# Patient Record
Sex: Female | Born: 1985
Health system: Southern US, Community
[De-identification: ages and names within clinical notes are randomized; demographics above are authoritative.]

## PROBLEM LIST (undated history)

## (undated) DIAGNOSIS — Z789 Other specified health status: Secondary | ICD-10-CM

## (undated) DIAGNOSIS — M5030 Other cervical disc degeneration, unspecified cervical region: Secondary | ICD-10-CM

## (undated) HISTORY — DX: Other specified health status: Z78.9

## (undated) HISTORY — PX: LIPOSUCTION: SHX10

---
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
## 2009-05-04 ENCOUNTER — Emergency Department: Payer: Self-pay | Admitting: Unknown Physician Specialty

## 2018-08-18 ENCOUNTER — Emergency Department (HOSPITAL_COMMUNITY): Payer: 59

## 2018-08-18 ENCOUNTER — Emergency Department (HOSPITAL_COMMUNITY)
Admission: EM | Admit: 2018-08-18 | Discharge: 2018-08-18 | Disposition: A | Discharge status: Home / Self Care | Payer: 59 | Attending: Emergency Medicine | Admitting: Emergency Medicine

## 2018-08-18 ENCOUNTER — Encounter (HOSPITAL_COMMUNITY): Payer: Self-pay | Admitting: Emergency Medicine

## 2018-08-18 ENCOUNTER — Other Ambulatory Visit: Payer: Self-pay

## 2018-08-18 DIAGNOSIS — N946 Dysmenorrhea, unspecified: Secondary | ICD-10-CM

## 2018-08-18 DIAGNOSIS — R1084 Generalized abdominal pain: Secondary | ICD-10-CM | POA: Diagnosis present

## 2018-08-18 LAB — CBC WITH DIFFERENTIAL/PLATELET
Abs Immature Granulocytes: 0.04 10*3/uL (ref 0.00–0.07)
Basophils Absolute: 0.1 10*3/uL (ref 0.0–0.1)
Basophils Relative: 0 %
Eosinophils Absolute: 0.2 10*3/uL (ref 0.0–0.5)
Eosinophils Relative: 2 %
HCT: 40.1 % (ref 36.0–46.0)
Hemoglobin: 13 g/dL (ref 12.0–15.0)
Immature Granulocytes: 0 %
Lymphocytes Relative: 19 %
Lymphs Abs: 2.6 10*3/uL (ref 0.7–4.0)
MCH: 28.1 pg (ref 26.0–34.0)
MCHC: 32.4 g/dL (ref 30.0–36.0)
MCV: 86.8 fL (ref 80.0–100.0)
Monocytes Absolute: 0.6 10*3/uL (ref 0.1–1.0)
Monocytes Relative: 4 %
Neutro Abs: 10.2 10*3/uL — ABNORMAL HIGH (ref 1.7–7.7)
Neutrophils Relative %: 75 %
Platelets: 310 10*3/uL (ref 150–400)
RBC: 4.62 MIL/uL (ref 3.87–5.11)
RDW: 13.3 % (ref 11.5–15.5)
WBC: 13.7 10*3/uL — ABNORMAL HIGH (ref 4.0–10.5)
nRBC: 0 % (ref 0.0–0.2)

## 2018-08-18 LAB — BASIC METABOLIC PANEL
Anion gap: 12 (ref 5–15)
BUN: 12 mg/dL (ref 6–20)
CO2: 19 mmol/L — ABNORMAL LOW (ref 22–32)
Calcium: 9.5 mg/dL (ref 8.9–10.3)
Chloride: 108 mmol/L (ref 98–111)
Creatinine, Ser: 0.91 mg/dL (ref 0.44–1.00)
GFR calc Af Amer: >60 mL/min (ref >60)
GFR calc non Af Amer: >60 mL/min (ref >60)
Glucose, Bld: 121 mg/dL — ABNORMAL HIGH (ref 70–99)
Potassium: 3.7 mmol/L (ref 3.5–5.1)
Sodium: 139 mmol/L (ref 135–145)

## 2018-08-18 LAB — I-STAT BETA HCG BLOOD, ED (MC, WL, AP ONLY): I-stat hCG, quantitative: <5 m[IU]/mL (ref <5)

## 2018-08-18 MED ORDER — HYDROMORPHONE HCL 1 MG/ML IJ SOLN
1.0000 mg | Freq: Once | INTRAMUSCULAR | Status: AC
  Administered 2018-08-18: 1 mg via INTRAVENOUS
  Filled 2018-08-18: qty 1

## 2018-08-18 MED ORDER — SODIUM CHLORIDE 0.9 % IV BOLUS
1000.0000 mL | Freq: Once | INTRAVENOUS | Status: AC
  Administered 2018-08-18: 1000 mL via INTRAVENOUS

## 2018-08-18 MED ORDER — KETOROLAC TROMETHAMINE 30 MG/ML IJ SOLN
15.0000 mg | Freq: Once | INTRAMUSCULAR | Status: AC
  Administered 2018-08-18: 15 mg via INTRAVENOUS
  Filled 2018-08-18: qty 1

## 2018-08-18 MED ORDER — HYDROCODONE-ACETAMINOPHEN 5-325 MG PO TABS: 1.0000 | ORAL_TABLET | ORAL | 0 refills | Status: DC | PRN

## 2018-08-18 MED ORDER — IBUPROFEN 800 MG PO TABS: 800.0000 mg | ORAL_TABLET | Freq: Four times a day (QID) | ORAL | 0 refills | Status: DC | PRN

## 2018-08-18 MED ORDER — IOPAMIDOL (ISOVUE-300) INJECTION 61%
100.0000 mL | Freq: Once | INTRAVENOUS | Status: AC | PRN
  Administered 2018-08-18: 100 mL via INTRAVENOUS

## 2018-08-18 MED ORDER — ONDANSETRON HCL 4 MG/2ML IJ SOLN
4.0000 mg | Freq: Once | INTRAMUSCULAR | Status: AC
  Administered 2018-08-18: 4 mg via INTRAVENOUS
  Filled 2018-08-18: qty 2

## 2018-08-18 NOTE — ED Triage Notes (Signed)
Pt coming in by EMS. Reporting 10 out of 10 pelvic/abd pain that started around 0300. Pt is on 2nd day of her menstrual cycle. Pt diaphoretic. Hx of painful menstrual cramps but never this bad.

## 2018-08-18 NOTE — ED Notes (Signed)
Pt transported to CT ?

## 2018-08-18 NOTE — ED Provider Notes (Signed)
[facility] EMERGENCY DEPARTMENT Provider Note   CSN: [id number] Arrival date & time: 08/18/18  9371     History   Chief Complaint Chief Complaint  Patient presents with  . Abdominal Cramping    Menstral cramping    HPI Tammy Jacobson is a 33 y.o. female.  Patient presents to the emergency department for evaluation of severe abdominal and pelvic cramping.  Patient reports that she was awakened from sleep at 3 AM.  Patient reports that she is in the second day of her menstrual period, does not usually have severe cramping like this.  She thinks it might be menstrual cramps, however.  No fever.  No vomiting, diarrhea.     History reviewed. No pertinent past medical history.  There are no active problems to display for this patient.      OB History   No obstetric history on file.      Home Medications    Prior to Admission medications   Medication Sig Start Date End Date Taking? Authorizing Provider  acetaminophen (TYLENOL) 500 MG tablet Take 1,000 mg by mouth every 6 (six) hours as needed for mild pain.   Yes [provider]  HYDROcodone-acetaminophen (NORCO/VICODIN) 5-325 MG tablet Take 1-2 tablets by mouth every 4 (four) hours as needed for moderate pain. 08/18/18   Gilda Crease, MD  ibuprofen (ADVIL,MOTRIN) 800 MG tablet Take 1 tablet (800 mg total) by mouth every 6 (six) hours as needed for moderate pain. 08/18/18   Gilda Crease, MD    Family History No family history on file.  Social History Social History   Tobacco Use  . Smoking status: Not on file  Substance Use Topics  . Alcohol use: Not on file  . Drug use: Not on file     Allergies   Penicillins   Review of Systems Review of Systems  Gastrointestinal: Positive for abdominal pain.  Genitourinary: Positive for pelvic pain.  All other systems reviewed and are negative.    Physical Exam Updated Vital Signs BP 122/86   Pulse 73   Temp 97.6  F (36.4 C) (Oral)   Resp 20   Ht 5\' 9"  (1.753 m)   Wt 104.3 kg   SpO2 100%   BMI 33.97 kg/m   Physical Exam Vitals signs and nursing note reviewed.  Constitutional:      General: She is in acute distress.     Appearance: Normal appearance. She is well-developed.  HENT:     Head: Normocephalic and atraumatic.     Right Ear: Hearing normal.     Left Ear: Hearing normal.     Nose: Nose normal.  Eyes:     Conjunctiva/sclera: Conjunctivae normal.     Pupils: Pupils are equal, round, and reactive to light.  Neck:     Musculoskeletal: Normal range of motion and neck supple.  Cardiovascular:     Rate and Rhythm: Regular rhythm.     Heart sounds: S1 normal and S2 normal. No murmur. No friction rub. No gallop.   Pulmonary:     Effort: Pulmonary effort is normal. No respiratory distress.     Breath sounds: Normal breath sounds.  Chest:     Chest wall: No tenderness.  Abdominal:     General: Bowel sounds are normal.     Palpations: Abdomen is soft.     Tenderness: There is abdominal tenderness in the right lower quadrant, suprapubic area and left lower quadrant. There is no guarding  or rebound. Negative signs include Murphy's sign and McBurney's sign.     Hernia: No hernia is present.  Musculoskeletal: Normal range of motion.  Skin:    General: Skin is warm and dry.     Findings: No rash.  Neurological:     Mental Status: She is alert and oriented to person, place, and time.     GCS: GCS eye subscore is 4. GCS verbal subscore is 5. GCS motor subscore is 6.     Cranial Nerves: No cranial nerve deficit.     Sensory: No sensory deficit.     Coordination: Coordination normal.  Psychiatric:        Speech: Speech normal.        Behavior: Behavior normal.        Thought Content: Thought content normal.      ED Treatments / Results  Labs (all labs ordered are listed, but only abnormal results are displayed) Labs Reviewed  CBC WITH DIFFERENTIAL/PLATELET - Abnormal; Notable for  the following components:      Result Value   WBC 13.7 (*)    Neutro Abs 10.2 (*)    All other components within normal limits  BASIC METABOLIC PANEL - Abnormal; Notable for the following components:   CO2 19 (*)    Glucose, Bld 121 (*)    All other components within normal limits  I-STAT BETA HCG BLOOD, ED ([facility], [facility], [facility] ONLY)    EKG None  Radiology Ct Abdomen Pelvis W Contrast  Result Date: 08/18/2018 CLINICAL DATA:  Acute onset abdominal and pelvic pain at 3 a.m. last night. EXAM: CT ABDOMEN AND PELVIS WITH CONTRAST TECHNIQUE: Multidetector CT imaging of the abdomen and pelvis was performed using the standard protocol following bolus administration of intravenous contrast. CONTRAST:  100 mL ISOVUE-300 IOPAMIDOL (ISOVUE-300) INJECTION 61% COMPARISON:  CT abdomen and pelvis 08/11/2003. FINDINGS: Lower chest: The lung bases are clear. No pleural or pericardial effusion. Hepatobiliary: No focal liver abnormality is seen. No gallstones, gallbladder wall thickening, or biliary dilatation. Pancreas: Unremarkable. No pancreatic ductal dilatation or surrounding inflammatory changes. Spleen: Normal in size without focal abnormality. Adrenals/Urinary Tract: Adrenal glands are unremarkable. Kidneys are normal, without renal calculi, focal lesion, or hydronephrosis. Bladder is unremarkable. Stomach/Bowel: Stomach is within normal limits. Appendix appears normal. No evidence of bowel wall thickening, distention, or inflammatory changes. Vascular/Lymphatic: No significant vascular findings are present. No enlarged abdominal or pelvic lymph nodes. Reproductive: Uterus and bilateral adnexa are unremarkable. Other: None. Musculoskeletal: No acute or focal abnormality. No acute abnormality. 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 IMPRESSION: Negative CT abdomen and pelvis. No finding to explain the patient's symptoms.  Electronically Signed   By: Drusilla Kanner M.D.   On: 08/18/2018 07:19    Procedures Procedures (including critical care time)  Medications Ordered in ED Medications  sodium chloride 0.9 % bolus 1,000 mL (1,000 mLs Intravenous New Bag/Given 08/18/18 0546)  HYDROmorphone (DILAUDID) injection 1 mg (1 mg Intravenous Given 08/18/18 0544)  ondansetron (ZOFRAN) injection 4 mg (4 mg Intravenous Given 08/18/18 0544)  ketorolac (TORADOL) 30 MG/ML injection 15 mg (15 mg Intravenous Given 08/18/18 0630)  iopamidol (ISOVUE-300) 61 % injection 100 mL (100 mLs Intravenous Contrast Given 08/18/18 0651)     Initial Impression / Assessment and Plan / ED Course  I have reviewed the triage vital signs and the nursing notes.  Pertinent labs & imaging results that were available  during my care of the patient were reviewed by me and considered in my medical decision making (see chart for details).    Patient presents to the emergency department for evaluation of pelvic cramping.  Patient is in her second day of her menstrual period.  She reports that she was having normal cramping initially but overnight developed severe constant cramping.  She is not having any significantly excessive bleeding.  She has a history of bad cramping with her menstruation, but has not had severe cramps in a couple of years and this is worse than what she has had in the past.  Lab work is unremarkable.  She has not pregnant.  CT scan is normal.  Patient administered Dilaudid with improvement.  She was given a dose of Toradol and has had complete resolution of her pain.  I do not believe that she needs further work-up such as ultrasound.  No concern for torsion at this time.  Patient has follow-up in a couple of days with her OB/GYN to talk about fertility treatments.  She will be treated with analgesia.   Final Clinical Impressions(s) / ED Diagnoses   Final diagnoses:  Dysmenorrhea    ED Discharge Orders         Ordered     HYDROcodone-acetaminophen (NORCO/VICODIN) 5-325 MG tablet  Every 4 hours PRN     08/18/18 0730    ibuprofen (ADVIL,MOTRIN) 800 MG tablet  Every 6 hours PRN     08/18/18 0730           Gilda CreasePollina, Jaisen Wiltrout J, MD 08/18/18 0730

## 2019-11-06 DIAGNOSIS — D485 Neoplasm of uncertain behavior of skin: Secondary | ICD-10-CM | POA: Diagnosis not present

## 2019-11-06 DIAGNOSIS — D225 Melanocytic nevi of trunk: Secondary | ICD-10-CM | POA: Diagnosis not present

## 2020-01-17 DIAGNOSIS — O26899 Other specified pregnancy related conditions, unspecified trimester: Secondary | ICD-10-CM | POA: Diagnosis not present

## 2020-01-17 DIAGNOSIS — Z3400 Encounter for supervision of normal first pregnancy, unspecified trimester: Secondary | ICD-10-CM | POA: Diagnosis not present

## 2020-01-17 DIAGNOSIS — Z3401 Encounter for supervision of normal first pregnancy, first trimester: Secondary | ICD-10-CM | POA: Diagnosis not present

## 2020-01-17 LAB — OB RESULTS CONSOLE HIV ANTIBODY (ROUTINE TESTING): HIV: NONREACTIVE

## 2020-01-17 LAB — OB RESULTS CONSOLE HEPATITIS B SURFACE ANTIGEN: Hepatitis B Surface Ag: NEGATIVE

## 2020-01-17 LAB — OB RESULTS CONSOLE RUBELLA ANTIBODY, IGM: Rubella: IMMUNE

## 2020-01-25 DIAGNOSIS — Z3490 Encounter for supervision of normal pregnancy, unspecified, unspecified trimester: Secondary | ICD-10-CM | POA: Diagnosis not present

## 2020-01-25 DIAGNOSIS — Z3401 Encounter for supervision of normal first pregnancy, first trimester: Secondary | ICD-10-CM | POA: Diagnosis not present

## 2020-01-25 LAB — OB RESULTS CONSOLE GC/CHLAMYDIA
Chlamydia: NEGATIVE
Gonorrhea: NEGATIVE

## 2020-02-22 DIAGNOSIS — Z3401 Encounter for supervision of normal first pregnancy, first trimester: Secondary | ICD-10-CM | POA: Diagnosis not present

## 2020-02-22 DIAGNOSIS — R350 Frequency of micturition: Secondary | ICD-10-CM | POA: Diagnosis not present

## 2020-04-03 DIAGNOSIS — Z36 Encounter for antenatal screening for chromosomal anomalies: Secondary | ICD-10-CM | POA: Diagnosis not present

## 2020-04-03 DIAGNOSIS — Z3402 Encounter for supervision of normal first pregnancy, second trimester: Secondary | ICD-10-CM | POA: Diagnosis not present

## 2020-05-07 DIAGNOSIS — Z03818 Encounter for observation for suspected exposure to other biological agents ruled out: Secondary | ICD-10-CM | POA: Diagnosis not present

## 2020-05-07 DIAGNOSIS — Z20822 Contact with and (suspected) exposure to covid-19: Secondary | ICD-10-CM | POA: Diagnosis not present

## 2020-05-16 DIAGNOSIS — Z3490 Encounter for supervision of normal pregnancy, unspecified, unspecified trimester: Secondary | ICD-10-CM | POA: Diagnosis not present

## 2020-05-16 DIAGNOSIS — Z3402 Encounter for supervision of normal first pregnancy, second trimester: Secondary | ICD-10-CM | POA: Diagnosis not present

## 2020-05-16 LAB — OB RESULTS CONSOLE RPR: RPR: NONREACTIVE

## 2020-06-04 DIAGNOSIS — O26899 Other specified pregnancy related conditions, unspecified trimester: Secondary | ICD-10-CM | POA: Diagnosis not present

## 2020-06-04 DIAGNOSIS — Z6791 Unspecified blood type, Rh negative: Secondary | ICD-10-CM | POA: Diagnosis not present

## 2020-06-19 DIAGNOSIS — Z23 Encounter for immunization: Secondary | ICD-10-CM | POA: Diagnosis not present

## 2020-06-19 DIAGNOSIS — R030 Elevated blood-pressure reading, without diagnosis of hypertension: Secondary | ICD-10-CM | POA: Diagnosis not present

## 2020-07-02 DIAGNOSIS — Z23 Encounter for immunization: Secondary | ICD-10-CM | POA: Diagnosis not present

## 2020-08-17 NOTE — L&D Delivery Note (Signed)
Delivery Note At 1:09 AM a viable female was delivered via Vaginal, Spontaneous (Presentation:   Left occiput anterior    ).  APGAR: 9, 9; weight  .   Placenta status: Spontaneous, Intact. 3 vessel Cord:   with the following complications:  none.  Cord pH: NA  Anesthesia:  Epidural  Episiotomy: None Lacerations: 2nd degree;Perineal Suture Repair: 3.0 vicryl Est. Blood Loss (mL):  500 mL .Marland Kitchen uterine atony resolved with 1000 mcg of cytotec per rectum and pitocin IV.. pt also received 1 gram of TXA  Mom to postpartum.  Baby to Couplet care / Skin to Skin.  Gerald Leitz 09/03/2020, 1:38 AM

## 2020-08-19 ENCOUNTER — Telehealth (HOSPITAL_COMMUNITY): Payer: Self-pay | Admitting: *Deleted

## 2020-08-19 ENCOUNTER — Encounter (HOSPITAL_COMMUNITY): Payer: Self-pay | Admitting: *Deleted

## 2020-08-19 NOTE — Telephone Encounter (Signed)
Preadmission screen  

## 2020-08-26 ENCOUNTER — Encounter (HOSPITAL_COMMUNITY): Payer: 59

## 2020-08-26 DIAGNOSIS — O99213 Obesity complicating pregnancy, third trimester: Secondary | ICD-10-CM | POA: Diagnosis not present

## 2020-08-26 DIAGNOSIS — O09513 Supervision of elderly primigravida, third trimester: Secondary | ICD-10-CM | POA: Diagnosis not present

## 2020-08-31 ENCOUNTER — Other Ambulatory Visit (HOSPITAL_COMMUNITY)
Admission: RE | Admit: 2020-08-31 | Discharge: 2020-08-31 | Disposition: A | Discharge status: Home / Self Care | Payer: BC Managed Care – PPO | Source: Ambulatory Visit | Attending: Obstetrics and Gynecology | Admitting: Obstetrics and Gynecology

## 2020-08-31 DIAGNOSIS — O26893 Other specified pregnancy related conditions, third trimester: Secondary | ICD-10-CM | POA: Diagnosis not present

## 2020-08-31 DIAGNOSIS — O480 Post-term pregnancy: Secondary | ICD-10-CM | POA: Diagnosis not present

## 2020-08-31 DIAGNOSIS — Z880 Allergy status to penicillin: Secondary | ICD-10-CM | POA: Diagnosis not present

## 2020-08-31 DIAGNOSIS — Z3A41 41 weeks gestation of pregnancy: Secondary | ICD-10-CM | POA: Diagnosis not present

## 2020-08-31 DIAGNOSIS — Z6791 Unspecified blood type, Rh negative: Secondary | ICD-10-CM | POA: Diagnosis not present

## 2020-08-31 DIAGNOSIS — O99824 Streptococcus B carrier state complicating childbirth: Secondary | ICD-10-CM | POA: Diagnosis not present

## 2020-08-31 DIAGNOSIS — Z01812 Encounter for preprocedural laboratory examination: Secondary | ICD-10-CM | POA: Insufficient documentation

## 2020-08-31 DIAGNOSIS — Z20822 Contact with and (suspected) exposure to covid-19: Secondary | ICD-10-CM | POA: Diagnosis not present

## 2020-08-31 DIAGNOSIS — Z3A40 40 weeks gestation of pregnancy: Secondary | ICD-10-CM | POA: Diagnosis not present

## 2020-08-31 LAB — SARS CORONAVIRUS 2 (TAT 6-24 HRS): SARS Coronavirus 2: NEGATIVE

## 2020-09-01 ENCOUNTER — Other Ambulatory Visit: Payer: Self-pay | Admitting: Obstetrics and Gynecology

## 2020-09-01 ENCOUNTER — Other Ambulatory Visit (HOSPITAL_COMMUNITY): Payer: Self-pay

## 2020-09-01 NOTE — H&P (Deleted)
  The note originally documented on this encounter has been moved the the encounter in which it belongs.  

## 2020-09-01 NOTE — H&P (Signed)
Tammy Jacobson is a 35 y.o. female G1P0 at 40 weeks and 6 days  presenting for induction of labor due to postdates. Pregnancy has been uncomplicated. Prenatal care provided by Dr. Gerald Leitz with [facility].   U/S for EFW on 08/26/2020  8 lbs 4 oz at the 62%ile.  . OB History    Gravida  1   Para      Term      Preterm      AB      Living        SAB      IAB      Ectopic      Multiple      Live Births             Past Medical History:  Diagnosis Date  . Medical history non-contributory    Past Surgical History:  Procedure Laterality Date  . LIPOSUCTION     Family History: family history is not on file. Social History:  reports that she has never smoked. She has never used smokeless tobacco. She reports previous alcohol use. She reports that she does not use drugs.     Maternal Diabetes: No Genetic Screening: Normal Maternal Ultrasounds/Referrals: Normal Fetal Ultrasounds or other Referrals:  None Maternal Substance Abuse:  No Significant Maternal Medications:  None Significant Maternal Lab Results:  Group B Strep positive Other Comments:  None  Review of Systems  Constitutional: Negative.   HENT: Negative.   Eyes: Negative.   Respiratory: Negative.   Cardiovascular: Negative.   Gastrointestinal: Negative.   Endocrine: Negative.   Genitourinary: Negative.   Musculoskeletal: Negative.   Skin: Negative.   Allergic/Immunologic: Negative.   Neurological: Negative.   Hematological: Negative.    History   Last menstrual period 11/21/2019. Maternal Exam:  Introitus: Normal vulva.   Physical Exam Vitals reviewed.  Constitutional:      Appearance: Normal appearance.  HENT:     Head: Normocephalic and atraumatic.     Mouth/Throat:     Mouth: Mucous membranes are moist.  Cardiovascular:     Rate and Rhythm: Normal rate and regular rhythm.     Pulses: Normal pulses.     Heart sounds: Normal heart sounds.  Pulmonary:     Effort:  Pulmonary effort is normal.     Breath sounds: Normal breath sounds.  Abdominal:     Tenderness: There is no abdominal tenderness.  Genitourinary:    General: Normal vulva.  Musculoskeletal:        General: Normal range of motion.     Cervical back: Normal range of motion and neck supple.  Skin:    General: Skin is warm and dry.  Neurological:     General: No focal deficit present.     Mental Status: She is alert and oriented to person, place, and time.  Psychiatric:        Mood and Affect: Mood normal.     Prenatal labs: ABO, Rh:   A Negative  Antibody:  Negative  Rubella: Immune (06/02 0000) RPR: Nonreactive (09/30 0000)  HBsAg: Negative (06/02 0000)  HIV: Non-reactive (06/02 0000)  GBS:   Positive   Assessment/Plan: 40 weeks and 6 days for induction of labor due to post dates.  - cytotec for cervical ripening -Vancomycin for gbs prophylaxis due to penicillin allergy.  - Anticipate SVD  - RH negative.. plan rhogham post delivery if indicated.  Gerald Leitz 09/01/2020, 9:39 AM

## 2020-09-02 ENCOUNTER — Inpatient Hospital Stay (HOSPITAL_COMMUNITY): Payer: BC Managed Care – PPO | Admitting: Anesthesiology

## 2020-09-02 ENCOUNTER — Inpatient Hospital Stay (HOSPITAL_COMMUNITY): Payer: BC Managed Care – PPO

## 2020-09-02 ENCOUNTER — Inpatient Hospital Stay (HOSPITAL_COMMUNITY)
Admission: AD | Admit: 2020-09-02 | Discharge: 2020-09-04 | DRG: 807 | Disposition: A | Discharge status: Home / Self Care | Payer: BC Managed Care – PPO | Attending: Obstetrics and Gynecology | Admitting: Obstetrics and Gynecology

## 2020-09-02 ENCOUNTER — Other Ambulatory Visit: Payer: Self-pay

## 2020-09-02 DIAGNOSIS — O480 Post-term pregnancy: Principal | ICD-10-CM | POA: Diagnosis present

## 2020-09-02 DIAGNOSIS — Z6791 Unspecified blood type, Rh negative: Secondary | ICD-10-CM

## 2020-09-02 DIAGNOSIS — Z20822 Contact with and (suspected) exposure to covid-19: Secondary | ICD-10-CM | POA: Diagnosis present

## 2020-09-02 DIAGNOSIS — O26893 Other specified pregnancy related conditions, third trimester: Secondary | ICD-10-CM | POA: Diagnosis present

## 2020-09-02 DIAGNOSIS — Z880 Allergy status to penicillin: Secondary | ICD-10-CM | POA: Diagnosis not present

## 2020-09-02 DIAGNOSIS — O99824 Streptococcus B carrier state complicating childbirth: Secondary | ICD-10-CM | POA: Diagnosis present

## 2020-09-02 DIAGNOSIS — Z3A40 40 weeks gestation of pregnancy: Secondary | ICD-10-CM | POA: Diagnosis not present

## 2020-09-02 LAB — CBC
HCT: 34.1 % — ABNORMAL LOW (ref 36.0–46.0)
Hemoglobin: 10.7 g/dL — ABNORMAL LOW (ref 12.0–15.0)
MCH: 27.6 pg (ref 26.0–34.0)
MCHC: 31.4 g/dL (ref 30.0–36.0)
MCV: 88.1 fL (ref 80.0–100.0)
Platelets: 223 10*3/uL (ref 150–400)
RBC: 3.87 MIL/uL (ref 3.87–5.11)
RDW: 15.8 % — ABNORMAL HIGH (ref 11.5–15.5)
WBC: 9.8 10*3/uL (ref 4.0–10.5)
nRBC: 0 % (ref 0.0–0.2)

## 2020-09-02 LAB — TYPE AND SCREEN
ABO/RH(D): A NEG
Antibody Screen: NEGATIVE

## 2020-09-02 LAB — RPR: RPR Ser Ql: NONREACTIVE

## 2020-09-02 MED ORDER — LACTATED RINGERS IV SOLN
500.0000 mL | Freq: Once | INTRAVENOUS | Status: AC
  Administered 2020-09-02: 500 mL via INTRAVENOUS

## 2020-09-02 MED ORDER — OXYTOCIN-SODIUM CHLORIDE 30-0.9 UT/500ML-% IV SOLN
1.0000 m[IU]/min | INTRAVENOUS | Status: DC
  Administered 2020-09-02: 2 m[IU]/min via INTRAVENOUS

## 2020-09-02 MED ORDER — EPHEDRINE 5 MG/ML INJ: 10.0000 mg | INTRAVENOUS | Status: DC | PRN

## 2020-09-02 MED ORDER — LACTATED RINGERS IV SOLN: INTRAVENOUS | Status: DC

## 2020-09-02 MED ORDER — LACTATED RINGERS IV SOLN: 500.0000 mL | Freq: Once | INTRAVENOUS | Status: DC

## 2020-09-02 MED ORDER — VANCOMYCIN HCL IN DEXTROSE 1-5 GM/200ML-% IV SOLN
1000.0000 mg | Freq: Two times a day (BID) | INTRAVENOUS | Status: DC
  Administered 2020-09-02 (×2): 1000 mg via INTRAVENOUS
  Filled 2020-09-02 (×2): qty 200

## 2020-09-02 MED ORDER — PHENYLEPHRINE 40 MCG/ML (10ML) SYRINGE FOR IV PUSH (FOR BLOOD PRESSURE SUPPORT): 80.0000 ug | PREFILLED_SYRINGE | INTRAVENOUS | Status: DC | PRN

## 2020-09-02 MED ORDER — ONDANSETRON HCL 4 MG/2ML IJ SOLN
4.0000 mg | Freq: Four times a day (QID) | INTRAMUSCULAR | Status: DC | PRN
  Administered 2020-09-02 (×2): 4 mg via INTRAVENOUS
  Filled 2020-09-02 (×2): qty 2

## 2020-09-02 MED ORDER — FENTANYL-BUPIVACAINE-NACL 0.5-0.125-0.9 MG/250ML-% EP SOLN
EPIDURAL | Status: AC
  Filled 2020-09-02: qty 250

## 2020-09-02 MED ORDER — OXYTOCIN BOLUS FROM INFUSION
333.0000 mL | Freq: Once | INTRAVENOUS | Status: AC
  Administered 2020-09-03: 333 mL via INTRAVENOUS

## 2020-09-02 MED ORDER — OXYCODONE-ACETAMINOPHEN 5-325 MG PO TABS: 2.0000 | ORAL_TABLET | ORAL | Status: DC | PRN

## 2020-09-02 MED ORDER — FENTANYL CITRATE (PF) 100 MCG/2ML IJ SOLN
50.0000 ug | INTRAMUSCULAR | Status: DC | PRN
  Administered 2020-09-02: 100 ug via INTRAVENOUS
  Filled 2020-09-02: qty 2

## 2020-09-02 MED ORDER — DIPHENHYDRAMINE HCL 50 MG/ML IJ SOLN: 12.5000 mg | INTRAMUSCULAR | Status: DC | PRN

## 2020-09-02 MED ORDER — ACETAMINOPHEN 325 MG PO TABS: 650.0000 mg | ORAL_TABLET | ORAL | Status: DC | PRN

## 2020-09-02 MED ORDER — TERBUTALINE SULFATE 1 MG/ML IJ SOLN: 0.2500 mg | Freq: Once | INTRAMUSCULAR | Status: DC | PRN

## 2020-09-02 MED ORDER — LIDOCAINE HCL (PF) 1 % IJ SOLN
INTRAMUSCULAR | Status: DC | PRN
  Administered 2020-09-02 (×2): 4 mL via EPIDURAL

## 2020-09-02 MED ORDER — OXYTOCIN-SODIUM CHLORIDE 30-0.9 UT/500ML-% IV SOLN
2.5000 [IU]/h | INTRAVENOUS | Status: DC
  Filled 2020-09-02: qty 500

## 2020-09-02 MED ORDER — LACTATED RINGERS IV SOLN: 500.0000 mL | INTRAVENOUS | Status: DC | PRN

## 2020-09-02 MED ORDER — PHENYLEPHRINE 40 MCG/ML (10ML) SYRINGE FOR IV PUSH (FOR BLOOD PRESSURE SUPPORT)
PREFILLED_SYRINGE | INTRAVENOUS | Status: AC
  Filled 2020-09-02: qty 10

## 2020-09-02 MED ORDER — OXYCODONE-ACETAMINOPHEN 5-325 MG PO TABS
1.0000 | ORAL_TABLET | ORAL | Status: DC | PRN
Start: 2020-09-02 — End: 2020-09-03

## 2020-09-02 MED ORDER — LIDOCAINE HCL (PF) 1 % IJ SOLN: 30.0000 mL | INTRAMUSCULAR | Status: DC | PRN

## 2020-09-02 MED ORDER — MISOPROSTOL 25 MCG QUARTER TABLET
25.0000 ug | ORAL_TABLET | ORAL | Status: DC | PRN
  Administered 2020-09-02 (×2): 25 ug via VAGINAL
  Filled 2020-09-02 (×3): qty 1

## 2020-09-02 MED ORDER — SODIUM CHLORIDE (PF) 0.9 % IJ SOLN
INTRAMUSCULAR | Status: DC | PRN
  Administered 2020-09-02: 12 mL/h via EPIDURAL

## 2020-09-02 MED ORDER — SOD CITRATE-CITRIC ACID 500-334 MG/5ML PO SOLN: 30.0000 mL | ORAL | Status: DC | PRN

## 2020-09-02 MED ORDER — FENTANYL-BUPIVACAINE-NACL 0.5-0.125-0.9 MG/250ML-% EP SOLN: 12.0000 mL/h | EPIDURAL | Status: DC | PRN

## 2020-09-02 NOTE — Progress Notes (Signed)
   Subjective: Patient is now comfortable with her epidural .   Objective: BP 119/66   Pulse 78   Temp 98 F (36.7 C) (Oral)   Resp 18   Ht 5\' 10"  (1.778 m)   Wt 132.2 kg   LMP 11/21/2019   SpO2 100%   BMI 41.82 kg/m  No intake/output data recorded. No intake/output data recorded.  FHT:  FHR: 120 bpm, variability: moderate,  accelerations:  Present,  decelerations:  Absent UC:   regular, every 3-4 minutes SVE:   5/100/-1 to 0 station  Labs: Lab Results  Component Value Date   WBC 9.8 09/02/2020   HGB 10.7 (L) 09/02/2020   HCT 34.1 (L) 09/02/2020   MCV 88.1 09/02/2020   PLT 223 09/02/2020    Assessment / Plan: induction of labor due to postdates.. increase pitocin to reach mvu's of at least 200    Preeclampsia:  NA Fetal Wellbeing:  Category I Pain Control:  Epidural I/D:  vancomycin Anticipated MOD:  NSVD  09/04/2020 09/02/2020, 7:57 PM

## 2020-09-02 NOTE — Progress Notes (Signed)
   Subjective: Patient is uncomfortable with contractions. Continued LOF no vaginal bleeding.   Objective: BP 123/63   Pulse 96   Temp 97.6 F (36.4 C)   Resp 17   Ht 5\' 10"  (1.778 m)   Wt 132.2 kg   LMP 11/21/2019   SpO2 100%   BMI 41.82 kg/m  No intake/output data recorded. No intake/output data recorded.  FHT:  FHR: 120 bpm, variability: moderate,  accelerations:  Present,  decelerations:  Present variable  UC:   regular, every 2 minutes SVE:   Dilation: 4.5 Effacement (%): 90 Station: -1,0 Exam by:: Venetia Prewitt  IUPC placed  Labs: Lab Results  Component Value Date   WBC 9.8 09/02/2020   HGB 10.7 (L) 09/02/2020   HCT 34.1 (L) 09/02/2020   MCV 88.1 09/02/2020   PLT 223 09/02/2020    Assessment / Plan: Induction of labor due to postterm,  progressing well on pitocin  Labor: Progressing normally Preeclampsia:  NA Fetal Wellbeing:  Category I and Category II Pain Control:  Epidural I/D:  vancomycin Anticipated MOD:  NSVD  09/04/2020 09/02/2020, 5:39 PM

## 2020-09-02 NOTE — Anesthesia Procedure Notes (Signed)
Epidural Patient location during procedure: OB Start time: 09/02/2020 12:50 PM End time: 09/02/2020 12:53 PM  Staffing Anesthesiologist: Kaylyn Layer, MD Performed: anesthesiologist   Preanesthetic Checklist Completed: patient identified, IV checked, risks and benefits discussed, monitors and equipment checked, pre-op evaluation and timeout performed  Epidural Patient position: sitting Prep: DuraPrep and site prepped and draped Patient monitoring: continuous pulse ox, blood pressure and heart rate Approach: midline Location: L3-L4 Injection technique: LOR air  Needle:  Needle type: Tuohy  Needle gauge: 17 G Needle length: 9 cm Needle insertion depth: 6 cm Catheter type: closed end flexible Catheter size: 19 Gauge Catheter at skin depth: 11 cm Test dose: negative and Other (1% lidocaine)  Assessment Events: blood not aspirated, injection not painful, no injection resistance, no paresthesia and negative IV test  Additional Notes Patient identified. Risks, benefits, and alternatives discussed with patient including but not limited to bleeding, infection, nerve damage, paralysis, failed block, incomplete pain control, headache, blood pressure changes, nausea, vomiting, reactions to medication, itching, and postpartum back pain. Confirmed with bedside nurse the patient's most recent platelet count. Confirmed with patient that they are not currently taking any anticoagulation, have any bleeding history, or any family history of bleeding disorders. Patient expressed understanding and wished to proceed. All questions were answered. Sterile technique was used throughout the entire procedure. Please see nursing notes for vital signs.   Crisp LOR on first pass. Test dose was given through epidural catheter and negative prior to continuing to dose epidural or start infusion. Warning signs of high block given to the patient including shortness of breath, tingling/numbness in hands, complete  motor block, or any concerning symptoms with instructions to call for help. Patient was given instructions on fall risk and not to get out of bed. All questions and concerns addressed with instructions to call with any issues or inadequate analgesia.  Reason for block:procedure for pain

## 2020-09-02 NOTE — Progress Notes (Signed)
Tammy Jacobson is a 35 y.o. G1P0 at [redacted]w[redacted]d by LMP admitted for induction of labor due to Post dates. Due date 08/27/2020.  Subjective: Patient is very uncomfortable with contractions ranked as 5-6 out of 10. The last few are getting stronger. No lof no vaginal bleeding + FM   Objective: BP 135/90   Pulse 97   Temp 98.5 F (36.9 C) (Oral)   Resp 20   Ht 5\' 10"  (1.778 m)   Wt 132.2 kg   LMP 11/21/2019   BMI 41.82 kg/m  No intake/output data recorded. No intake/output data recorded.  FHT:  FHR: 120 bpm, variability: moderate,  accelerations:  Present,  decelerations:  Absent UC:   regular, every 2 minutes SVE:   Dilation: Closed Effacement (%): 50 Station: -2 Exam by:: T [email or web address]: Lab Results  Component Value Date   WBC 9.8 09/02/2020   HGB 10.7 (L) 09/02/2020   HCT 34.1 (L) 09/02/2020   MCV 88.1 09/02/2020   PLT 223 09/02/2020    Assessment / Plan: induction of labor due to post dates. continue cervical ripening. pt currently contracting too often for another cytotec.  will place if contractions space out    Preeclampsia:  NA Fetal Wellbeing:  Category I Pain Control:  Labor support without medications I/D:  vancomycin Anticipated MOD:  NSVD  09/04/2020 09/02/2020, 10:52 AM

## 2020-09-02 NOTE — Anesthesia Preprocedure Evaluation (Signed)
Anesthesia Evaluation  Patient identified by MRN, date of birth, ID band Patient awake    Reviewed: Allergy & Precautions, Patient's Chart, lab work & pertinent test results  History of Anesthesia Complications Negative for: history of anesthetic complications  Airway Mallampati: II  TM Distance: >3 FB Neck ROM: Full    Dental no notable dental hx.    Pulmonary neg pulmonary ROS,    Pulmonary exam normal        Cardiovascular negative cardio ROS Normal cardiovascular exam     Neuro/Psych negative neurological ROS  negative psych ROS   GI/Hepatic negative GI ROS, Neg liver ROS,   Endo/Other  Morbid obesity  Renal/GU negative Renal ROS  negative genitourinary   Musculoskeletal negative musculoskeletal ROS (+)   Abdominal   Peds  Hematology negative hematology ROS (+)   Anesthesia Other Findings Day of surgery medications reviewed with patient.  Reproductive/Obstetrics (+) Pregnancy                             Anesthesia Physical Anesthesia Plan  ASA: III  Anesthesia Plan: Epidural   Post-op Pain Management:    Induction:   PONV Risk Score and Plan: Treatment may vary due to age or medical condition  Airway Management Planned: Natural Airway  Additional Equipment:   Intra-op Plan:   Post-operative Plan:   Informed Consent: I have reviewed the patients History and Physical, chart, labs and discussed the procedure including the risks, benefits and alternatives for the proposed anesthesia with the patient or authorized representative who has indicated his/her understanding and acceptance.       Plan Discussed with:   Anesthesia Plan Comments:         Anesthesia Quick Evaluation

## 2020-09-02 NOTE — Progress Notes (Signed)
Tammy Jacobson is a 35 y.o. G1P0 at [redacted]w[redacted]d by LMP admitted for induction of labor due to Post dates. Due date 08/27/2020.  Subjective:  patient rates contractions as 6 out of 10 since receiving epidural. SROM clear fluid at 1104  no vaginal bleeding.   Objective: BP 109/80   Pulse 95   Temp 98.5 F (36.9 C) (Oral)   Resp 20   Ht 5\' 10"  (1.778 m)   Wt 132.2 kg   LMP 11/21/2019   BMI 41.82 kg/m  No intake/output data recorded. No intake/output data recorded.  FHT:  FHR: 120 bpm, variability: moderate,  accelerations:  Present,  decelerations:  Absent UC:   regular, every 2 minutes SVE:   Dilation: Closed Effacement (%): 50 Station: -2 Exam by:: Dr [email or web address]  Labs: Lab Results  Component Value Date   WBC 9.8 09/02/2020   HGB 10.7 (L) 09/02/2020   HCT 34.1 (L) 09/02/2020   MCV 88.1 09/02/2020   PLT 223 09/02/2020    Assessment / Plan: induction of labor due to post dates. s/p cytotec x 2 with SROM and regular contractions currently.   Labor: continue with cervical ripening. cervix still closed. and contractions to frequent for  repeat cystotec. if no cervical change in 4 hours will start pitocin  Preeclampsia:  NA Fetal Wellbeing:  Category I Pain Control:  Epidural I/D:  vancomycin Anticipated MOD:  NSVD  09/04/2020 09/02/2020, 1:20 PM

## 2020-09-02 NOTE — Progress Notes (Signed)
F&m monitor placed dueto difficulty tracing uc's.  F&m monitor not trace fhr reliably so u/s applied.  F&m monitor tracing in blue u/s tracing  n yellow

## 2020-09-03 ENCOUNTER — Encounter (HOSPITAL_COMMUNITY): Payer: Self-pay | Admitting: Obstetrics and Gynecology

## 2020-09-03 LAB — CBC
HCT: 30.5 % — ABNORMAL LOW (ref 36.0–46.0)
Hemoglobin: 10.5 g/dL — ABNORMAL LOW (ref 12.0–15.0)
MCH: 29.7 pg (ref 26.0–34.0)
MCHC: 34.4 g/dL (ref 30.0–36.0)
MCV: 86.4 fL (ref 80.0–100.0)
Platelets: 217 10*3/uL (ref 150–400)
RBC: 3.53 MIL/uL — ABNORMAL LOW (ref 3.87–5.11)
RDW: 16 % — ABNORMAL HIGH (ref 11.5–15.5)
WBC: 16.9 10*3/uL — ABNORMAL HIGH (ref 4.0–10.5)
nRBC: 0 % (ref 0.0–0.2)

## 2020-09-03 MED ORDER — RHO D IMMUNE GLOBULIN 1500 UNIT/2ML IJ SOSY
300.0000 ug | PREFILLED_SYRINGE | Freq: Once | INTRAMUSCULAR | Status: AC
  Administered 2020-09-03: 300 ug via INTRAMUSCULAR
  Filled 2020-09-03: qty 2

## 2020-09-03 MED ORDER — TRANEXAMIC ACID-NACL 1000-0.7 MG/100ML-% IV SOLN: 1000.0000 mg | INTRAVENOUS | Status: AC

## 2020-09-03 MED ORDER — WITCH HAZEL-GLYCERIN EX PADS: 1.0000 "application " | MEDICATED_PAD | CUTANEOUS | Status: DC | PRN

## 2020-09-03 MED ORDER — MISOPROSTOL 200 MCG PO TABS
ORAL_TABLET | ORAL | Status: AC
  Administered 2020-09-03: 1000 ug via RECTAL
  Filled 2020-09-03: qty 5

## 2020-09-03 MED ORDER — SENNOSIDES-DOCUSATE SODIUM 8.6-50 MG PO TABS
2.0000 | ORAL_TABLET | Freq: Every day | ORAL | Status: DC
  Administered 2020-09-04: 2 via ORAL
  Filled 2020-09-03: qty 2

## 2020-09-03 MED ORDER — COCONUT OIL OIL
1.0000 "application " | TOPICAL_OIL | Status: DC | PRN
  Administered 2020-09-03: 1 via TOPICAL

## 2020-09-03 MED ORDER — BENZOCAINE-MENTHOL 20-0.5 % EX AERO
1.0000 "application " | INHALATION_SPRAY | CUTANEOUS | Status: DC | PRN
  Administered 2020-09-03: 1 via TOPICAL
  Filled 2020-09-03: qty 56

## 2020-09-03 MED ORDER — MISOPROSTOL 200 MCG PO TABS: 1000.0000 ug | ORAL_TABLET | Freq: Once | ORAL | Status: AC

## 2020-09-03 MED ORDER — MISOPROSTOL 200 MCG PO TABS
ORAL_TABLET | ORAL | Status: AC
  Filled 2020-09-03: qty 1

## 2020-09-03 MED ORDER — PRENATAL MULTIVITAMIN CH
1.0000 | ORAL_TABLET | Freq: Every day | ORAL | Status: DC
  Administered 2020-09-03 – 2020-09-04 (×2): 1 via ORAL
  Filled 2020-09-03 (×2): qty 1

## 2020-09-03 MED ORDER — FERROUS SULFATE 325 (65 FE) MG PO TABS
325.0000 mg | ORAL_TABLET | Freq: Every day | ORAL | Status: DC
  Administered 2020-09-03 – 2020-09-04 (×2): 325 mg via ORAL
  Filled 2020-09-03 (×2): qty 1

## 2020-09-03 MED ORDER — ONDANSETRON HCL 4 MG PO TABS: 4.0000 mg | ORAL_TABLET | ORAL | Status: DC | PRN

## 2020-09-03 MED ORDER — METHYLERGONOVINE MALEATE 0.2 MG PO TABS: 0.2000 mg | ORAL_TABLET | ORAL | Status: DC | PRN

## 2020-09-03 MED ORDER — METHYLERGONOVINE MALEATE 0.2 MG/ML IJ SOLN: 0.2000 mg | INTRAMUSCULAR | Status: DC | PRN

## 2020-09-03 MED ORDER — IBUPROFEN 600 MG PO TABS
600.0000 mg | ORAL_TABLET | Freq: Four times a day (QID) | ORAL | Status: DC
  Administered 2020-09-03 – 2020-09-04 (×6): 600 mg via ORAL
  Filled 2020-09-03 (×6): qty 1

## 2020-09-03 MED ORDER — SIMETHICONE 80 MG PO CHEW: 80.0000 mg | CHEWABLE_TABLET | ORAL | Status: DC | PRN

## 2020-09-03 MED ORDER — ACETAMINOPHEN 325 MG PO TABS
650.0000 mg | ORAL_TABLET | ORAL | Status: DC | PRN
  Administered 2020-09-03 (×3): 650 mg via ORAL
  Filled 2020-09-03 (×3): qty 2

## 2020-09-03 MED ORDER — ZOLPIDEM TARTRATE 5 MG PO TABS: 5.0000 mg | ORAL_TABLET | Freq: Every evening | ORAL | Status: DC | PRN

## 2020-09-03 MED ORDER — DIPHENHYDRAMINE HCL 25 MG PO CAPS: 25.0000 mg | ORAL_CAPSULE | Freq: Four times a day (QID) | ORAL | Status: DC | PRN

## 2020-09-03 MED ORDER — TRANEXAMIC ACID-NACL 1000-0.7 MG/100ML-% IV SOLN
INTRAVENOUS | Status: AC
  Administered 2020-09-03: 1000 mg via INTRAVENOUS
  Filled 2020-09-03: qty 100

## 2020-09-03 MED ORDER — DIBUCAINE (PERIANAL) 1 % EX OINT: 1.0000 "application " | TOPICAL_OINTMENT | CUTANEOUS | Status: DC | PRN

## 2020-09-03 MED ORDER — ONDANSETRON HCL 4 MG/2ML IJ SOLN: 4.0000 mg | INTRAMUSCULAR | Status: DC | PRN

## 2020-09-03 NOTE — Lactation Note (Signed)
Lactation Consultation Note  Patient Name: Tammy Jacobson [id number] Date: 09/03/2020 Reason for consult: L&D Initial assessment;1st time breastfeeding;Term Age:35 y.o.  1 hr, term female infant. Per mom, she did not take any BF classes in her pregnancy and she has Spectra 2 DEBP at home. LC entered room, mom had infant latched on her right breast using the football hold position, infant latched with depth, swallows observed and infant was still BF at 30 minutes.  LC did not assist with latch, briefly mention breast stimulation, gently stroking  Infant's neck and shoulder and doing breast compressions. LC praised mom for her breastfeeding efforts and congratulated family on infant's birth.  LC discussed with parents infant hunger cues and BF infant STS. LC explained RN and LC services are here to assist and support mom with breastfeeding on MBU. LC briefly explained community resources and left [facility] brochure in patient's room on MBU.  Maternal Data Formula Feeding for Exclusion: No Does the patient have breastfeeding experience prior to this delivery?: No  Feeding Feeding Type: Breast Fed  LATCH Score Latch: Repeated attempts needed to sustain latch, nipple held in mouth throughout feeding, stimulation needed to elicit sucking reflex.  Audible Swallowing: A few with stimulation  Type of Nipple: Everted at rest and after stimulation  Comfort (Breast/Nipple): Soft / non-tender  Hold (Positioning): Assistance needed to correctly position infant at breast and maintain latch.  LATCH Score: 7  Interventions Interventions: Skin to skin;Breast compression  Lactation Tools Discussed/Used WIC Program: No   Consult Status Consult Status: Follow-up Date: 09/03/20 Follow-up type: In-patient    Danelle Earthly 09/03/2020, 2:40 AM

## 2020-09-03 NOTE — Anesthesia Postprocedure Evaluation (Signed)
Anesthesia Post Note  Patient: CARI BURGO  Procedure(s) Performed: AN AD HOC LABOR EPIDURAL     Patient location during evaluation: Mother Baby Anesthesia Type: Epidural Level of consciousness: awake and alert Pain management: pain level controlled Vital Signs Assessment: post-procedure vital signs reviewed and stable Respiratory status: spontaneous breathing, nonlabored ventilation and respiratory function stable Cardiovascular status: stable Postop Assessment: no headache, no backache, epidural receding, patient able to bend at knees, no apparent nausea or vomiting, able to ambulate and adequate PO intake Anesthetic complications: no   No complications documented.  Last Vitals:  Vitals:   09/03/20 0350 09/03/20 0450  BP: 125/69 115/78  Pulse: 89 84  Resp: 16 16  Temp: 36.9 C 36.9 C  SpO2: 99% 100%    Last Pain:  Vitals:   09/03/20 0853  TempSrc:   PainSc: 0-No pain   Pain Goal:                   Blythe Stanford

## 2020-09-03 NOTE — Lactation Note (Signed)
This note was copied from a baby's chart. Lactation Consultation Note  Patient Name: Tammy Jacobson [id number] Date: 09/03/2020 Reason for consult: Initial assessment Age:35 years   Mother reports that infant is feeding well., Assist mother with hand expression,. Observed large drops of colostrum. Mother very happy to see this volume of colostrum. Mother reports that infant just finished a 75  Min feeding  Discussed feeding cues  Discussed cluster feeding.  Encouraged mother to do STS  Mother was given [facility] brochure and basic teaching done.   Mother to continue to cue base feed infant and feed at least 8-12 times or more in 24 hours and advised to allow for cluster feeding infant as needed.  Mother to continue to due STS. Mother is aware of available LC services at [facility], BFSG'S, OP Dept, and phone # for questions or concerns about breastfeeding.  Mother receptive to all teaching and plan of care.    Maternal Data Has patient been taught Hand Expression?: Yes Does the patient have breastfeeding experience prior to this delivery?: No  Feeding Feeding Type: Breast Fed  LATCH Score Latch: Repeated attempts needed to sustain latch, nipple held in mouth throughout feeding, stimulation needed to elicit sucking reflex.  Audible Swallowing: A few with stimulation  Type of Nipple: Everted at rest and after stimulation  Comfort (Breast/Nipple): Soft / non-tender  Hold (Positioning): Assistance needed to correctly position infant at breast and maintain latch.  LATCH Score: 7  Interventions Interventions: Breast feeding basics reviewed;Skin to skin;Position options  Lactation Tools Discussed/Used     Consult Status Consult Status: Follow-up Date: 09/04/20 Follow-up type: In-patient    Stevan Born [facility] 36/18/2022, 3:47 PM

## 2020-09-04 LAB — RH IG WORKUP (INCLUDES ABO/RH)
ABO/RH(D): A NEG
Fetal Screen: NEGATIVE
Gestational Age(Wks): 41
Unit division: 0

## 2020-09-04 MED ORDER — FERROUS SULFATE 325 (65 FE) MG PO TABS
325.0000 mg | ORAL_TABLET | Freq: Every day | ORAL | 0 refills | Status: AC
Start: 2020-09-05 — End: ?

## 2020-09-04 MED ORDER — IBUPROFEN 600 MG PO TABS: 600.0000 mg | ORAL_TABLET | Freq: Four times a day (QID) | ORAL | 0 refills | Status: AC | PRN

## 2020-09-04 NOTE — Discharge Summary (Signed)
   Postpartum Discharge Summary  Date of Service updated 09/04/2020     Patient Name: Tammy Jacobson DOB: 07/21/1986 MRN: [id number]  Date of admission: 09/02/2020 Delivery date:09/03/2020  Delivering provider: COLE, TARA  Date of discharge: 09/04/2020  Admitting diagnosis: Post-dates pregnancy [O48.0] Intrauterine pregnancy: [redacted]w[redacted]d     Secondary diagnosis:  Active Problems:   Post-dates pregnancy  Additional problems: None    Discharge diagnosis: Term Pregnancy Delivered                                              Post partum procedures:NA Augmentation: Pitocin and Cytotec Complications: None  Hospital course: Induction of Labor With Vaginal Delivery   34 y.o. yo G1P1001 at [redacted]w[redacted]d was admitted to the hospital 09/02/2020 for induction of labor.  Indication for induction: Postdates.  Patient had an uncomplicated labor course as follows: Membrane Rupture Time/Date: 11:04 AM ,09/02/2020   Delivery Method:Vaginal, Spontaneous  Episiotomy: None  Lacerations:  2nd degree;Perineal  Details of delivery can be found in separate delivery note.  Patient had a routine postpartum course. Patient is discharged home 09/04/20.  Newborn Data: Birth date:09/03/2020  Birth time:1:09 AM  Gender:Female  Living status:Living  Apgars:9 ,9  Weight:3141 g   Magnesium Sulfate received: No BMZ received: No Rhophylac:Yes MMR:N/A T-DaP:Given prenatally Flu: N/A Transfusion:No  Physical exam  Vitals:   09/03/20 1142 09/03/20 1731 09/03/20 2110 09/04/20 0525  BP: 112/70 116/77 113/72 116/70  Pulse:  79 88 72  Resp: 17 18 18 18  Temp: 98.4 F (36.9 C) 98 F (36.7 C) 98.2 F (36.8 C) 98.1 F (36.7 C)  TempSrc: Oral Oral Oral Oral  SpO2:  98% 99% 99%  Weight:      Height:       General: alert, cooperative and no distress Lochia: appropriate Uterine Fundus: firm Incision: N/A DVT Evaluation: No evidence of DVT seen on physical exam. Labs: Lab Results  Component Value Date   WBC  16.9 (H) 09/03/2020   HGB 10.5 (L) 09/03/2020   HCT 30.5 (L) 09/03/2020   MCV 86.4 09/03/2020   PLT 217 09/03/2020   CMP Latest Ref Rng & Units 08/18/2018  Glucose 70 - 99 mg/dL 121(H)  BUN 6 - 20 mg/dL 12  Creatinine 0.44 - 1.00 mg/dL 0.91  Sodium 135 - 145 mmol/L 139  Potassium 3.5 - 5.1 mmol/L 3.7  Chloride 98 - 111 mmol/L 108  CO2 22 - 32 mmol/L 19(L)  Calcium 8.9 - 10.3 mg/dL 9.5   Edinburgh Score: Edinburgh Postnatal Depression Scale Screening Tool 09/03/2020  I have been able to laugh and see the funny side of things. 0  I have looked forward with enjoyment to things. 0  I have blamed myself unnecessarily when things went wrong. 0  I have been anxious or worried for no good reason. 1  I have felt scared or panicky for no good reason. 0  Things have been getting on top of me. 1  I have been so unhappy that I have had difficulty sleeping. 0  I have felt sad or miserable. 1  I have been so unhappy that I have been crying. 0  The thought of harming myself has occurred to me. 0  Edinburgh Postnatal Depression Scale Total 3      After visit meds:  Allergies as of 09/04/2020        Reactions   Penicillins Nausea And Vomiting   Propoxyphene       Medication List    STOP taking these medications   HYDROcodone-acetaminophen 5-325 MG tablet Commonly known as: NORCO/VICODIN     TAKE these medications   acetaminophen 500 MG tablet Commonly known as: TYLENOL Take 1,000 mg by mouth every 6 (six) hours as needed for mild pain.   ferrous sulfate 325 (65 FE) MG tablet Take 1 tablet (325 mg total) by mouth daily with breakfast. Start taking on: September 05, 2020   ibuprofen 600 MG tablet Commonly known as: ADVIL Take 1 tablet (600 mg total) by mouth every 6 (six) hours as needed. What changed:   medication strength  how much to take  reasons to take this        Discharge home in stable condition Infant Feeding: Breast Infant Disposition:home with  mother Discharge instruction: per After Visit Summary and Postpartum booklet. Activity: Advance as tolerated. Pelvic rest for 6 weeks.  Diet: routine diet Anticipated Birth Control: Unsure Postpartum Appointment:6 weeks Additional Postpartum F/U: NA Future Appointments:No future appointments. Follow up Visit:  Follow-up Information    Cole, Tara, MD. Go in 6 week(s).   Specialty: Obstetrics and Gynecology Why: patient should already have a postpartum visit scheduled in 6 weeks  Contact information: [address] [place] [address] [phone]                   09/04/2020 Tara Cole, MD  

## 2020-09-04 NOTE — Lactation Note (Signed)
This note was copied from a baby's chart. Lactation Consultation Note  Patient Name: Boy Jadah Bobak [id number] Date: 09/04/2020   Age:36 hours  At time of consult, infant was s/p circ & sleeping in parents' arms. "Nolan" fed for 10-15 minutes about 1.5 hr ago after returning from his circ.  Mom with c/o sore nipples. I noted that she has a very mild compression stripe on her R breast and a compression stripe on her L breast. Mom agrees that there have been times when her nipples are pinched or compressed when infant releases latch. I showed her the specifics of an asymmetric latch via the animation on the [facility] website. Mom found that depiction to be helpful.  I discussed the sound/signs of swallowing & went over the expected output chart & the warning signs, on p. 19 of the [employer] postpartum book. Mom understands that if she were to see any warning signs, Mom would need to supplement.   Pacifier use noted at bedside; I recommended not using it at this time, not b/c of nipple confusion, but b/c it may cover up an infant's feeding cues.   I answered Mom's questions to her satisfaction.    Lurline Hare [facility] 09/04/2020, 2:17 PM

## 2020-10-21 DIAGNOSIS — F53 Postpartum depression: Secondary | ICD-10-CM | POA: Diagnosis not present

## 2020-10-21 DIAGNOSIS — Z8742 Personal history of other diseases of the female genital tract: Secondary | ICD-10-CM | POA: Diagnosis not present

## 2021-01-28 DIAGNOSIS — Z01419 Encounter for gynecological examination (general) (routine) without abnormal findings: Secondary | ICD-10-CM | POA: Diagnosis not present

## 2022-02-02 DIAGNOSIS — Z01419 Encounter for gynecological examination (general) (routine) without abnormal findings: Secondary | ICD-10-CM | POA: Diagnosis not present

## 2022-11-27 DIAGNOSIS — R21 Rash and other nonspecific skin eruption: Secondary | ICD-10-CM | POA: Diagnosis not present

## 2022-12-14 DIAGNOSIS — R21 Rash and other nonspecific skin eruption: Secondary | ICD-10-CM | POA: Diagnosis not present

## 2022-12-14 DIAGNOSIS — Z20822 Contact with and (suspected) exposure to covid-19: Secondary | ICD-10-CM | POA: Diagnosis not present

## 2022-12-14 DIAGNOSIS — R059 Cough, unspecified: Secondary | ICD-10-CM | POA: Diagnosis not present

## 2022-12-14 DIAGNOSIS — J069 Acute upper respiratory infection, unspecified: Secondary | ICD-10-CM | POA: Diagnosis not present

## 2022-12-16 DIAGNOSIS — L570 Actinic keratosis: Secondary | ICD-10-CM | POA: Diagnosis not present

## 2022-12-16 DIAGNOSIS — L309 Dermatitis, unspecified: Secondary | ICD-10-CM | POA: Diagnosis not present

## 2023-05-19 DIAGNOSIS — J019 Acute sinusitis, unspecified: Secondary | ICD-10-CM | POA: Diagnosis not present

## 2023-05-19 DIAGNOSIS — Z20822 Contact with and (suspected) exposure to covid-19: Secondary | ICD-10-CM | POA: Diagnosis not present

## 2023-07-14 DIAGNOSIS — L578 Other skin changes due to chronic exposure to nonionizing radiation: Secondary | ICD-10-CM | POA: Diagnosis not present

## 2023-07-14 DIAGNOSIS — D229 Melanocytic nevi, unspecified: Secondary | ICD-10-CM | POA: Diagnosis not present

## 2023-07-14 DIAGNOSIS — L821 Other seborrheic keratosis: Secondary | ICD-10-CM | POA: Diagnosis not present

## 2023-07-14 DIAGNOSIS — L814 Other melanin hyperpigmentation: Secondary | ICD-10-CM | POA: Diagnosis not present

## 2024-02-07 ENCOUNTER — Other Ambulatory Visit: Payer: Self-pay | Admitting: Physician Assistant

## 2024-02-07 DIAGNOSIS — M542 Cervicalgia: Secondary | ICD-10-CM

## 2024-02-07 DIAGNOSIS — M5412 Radiculopathy, cervical region: Secondary | ICD-10-CM

## 2024-03-03 ENCOUNTER — Encounter (HOSPITAL_BASED_OUTPATIENT_CLINIC_OR_DEPARTMENT_OTHER): Payer: Self-pay

## 2024-03-03 ENCOUNTER — Emergency Department (HOSPITAL_BASED_OUTPATIENT_CLINIC_OR_DEPARTMENT_OTHER)
Admission: EM | Admit: 2024-03-03 | Discharge: 2024-03-03 | Disposition: A | Discharge status: Home / Self Care | Attending: Emergency Medicine | Admitting: Emergency Medicine

## 2024-03-03 ENCOUNTER — Other Ambulatory Visit: Payer: Self-pay

## 2024-03-03 DIAGNOSIS — M542 Cervicalgia: Secondary | ICD-10-CM | POA: Diagnosis present

## 2024-03-03 DIAGNOSIS — M5412 Radiculopathy, cervical region: Secondary | ICD-10-CM | POA: Insufficient documentation

## 2024-03-03 HISTORY — DX: Other cervical disc degeneration, unspecified cervical region: M50.30

## 2024-03-03 LAB — CBG MONITORING, ED: Glucose-Capillary: 117 mg/dL — ABNORMAL HIGH (ref 70–99)

## 2024-03-03 MED ORDER — KETOROLAC TROMETHAMINE 30 MG/ML IJ SOLN
30.0000 mg | Freq: Once | INTRAMUSCULAR | Status: AC
  Administered 2024-03-03: 30 mg via INTRAMUSCULAR
  Filled 2024-03-03: qty 1

## 2024-03-03 MED ORDER — PROMETHAZINE HCL 25 MG/ML IJ SOLN
12.5000 mg | Freq: Once | INTRAMUSCULAR | Status: AC
  Administered 2024-03-03: 12.5 mg via INTRAMUSCULAR
  Filled 2024-03-03: qty 1

## 2024-03-03 MED ORDER — ONDANSETRON 4 MG PO TBDP
4.0000 mg | ORAL_TABLET | Freq: Three times a day (TID) | ORAL | 0 refills | Status: AC | PRN
Start: 2024-03-03 — End: ?

## 2024-03-03 MED ORDER — OXYCODONE-ACETAMINOPHEN 5-325 MG PO TABS
1.0000 | ORAL_TABLET | Freq: Once | ORAL | Status: AC
  Administered 2024-03-03: 1 via ORAL
  Filled 2024-03-03: qty 1

## 2024-03-03 NOTE — ED Triage Notes (Addendum)
 Pt is scheduled to have cervical sx fusion of C-4 - C-6 on Monday, states her pain became unbearable this am and came in for relief.  Does report new numbness to right arm to shoulder Pt has taken Soma 0030 Pt crying in pain

## 2024-03-03 NOTE — ED Provider Notes (Signed)
 [place] EMERGENCY DEPARTMENT AT [facility] Provider Note   CSN: [id number] Arrival date & time: 03/03/24  0458     Patient presents with: Neck Pain   Tammy Jacobson is a 38 y.o. female.   HPI     This is a 37 year old female who presents with neck pain.  Patient is scheduled to have cervical surgery on Monday.  Patient reports that over the last several days she has had difficulty controlling her pain.  She was on Soma and then was prescribed tramadol.  She states that the tramadol makes her nauseous.  She states the pain is mostly in her neck and her right arm which is unchanged.  She started having severe symptoms 12 weeks ago after playing with her son.  Her pain is worse but unchanged in character.  No new weakness.  She took Tylenol  but has not trialed any NSAIDs.  Prior to Admission medications   Medication Sig Start Date End Date Taking? Authorizing Provider  ondansetron  (ZOFRAN -ODT) 4 MG disintegrating tablet Take 1 tablet (4 mg total) by mouth every 8 (eight) hours as needed. 03/03/24  Yes Sandie Swayze, Charmaine FALCON, MD  acetaminophen  (TYLENOL ) 500 MG tablet Take 1,000 mg by mouth every 6 (six) hours as needed for mild pain.    [provider]  ferrous sulfate  325 (65 FE) MG tablet Take 1 tablet (325 mg total) by mouth daily with breakfast. 09/05/20   Rosalva Sawyer, MD  ibuprofen  (ADVIL ) 600 MG tablet Take 1 tablet (600 mg total) by mouth every 6 (six) hours as needed. 09/04/20   Rosalva Sawyer, MD    Allergies: Penicillins and Propoxyphene    Review of Systems  Constitutional:  Negative for fever.  Musculoskeletal:  Positive for neck pain.  Neurological:  Negative for weakness and numbness.  All other systems reviewed and are negative.   Updated Vital Signs BP 136/89   Pulse 91   Temp 97.7 F (36.5 C) (Oral)   Resp (!) 36   Ht 1.778 m (5' 10)   Wt 108.4 kg   LMP 02/27/2024 (Exact Date)   SpO2 100%   BMI 34.29 kg/m   Physical Exam Vitals and  nursing note reviewed.  Constitutional:      Appearance: She is well-developed. She is obese.     Comments: Tearful, non-ill-appearing  HENT:     Head: Normocephalic and atraumatic.  Eyes:     Pupils: Pupils are equal, round, and reactive to light.  Cardiovascular:     Rate and Rhythm: Normal rate and regular rhythm.  Pulmonary:     Effort: Pulmonary effort is normal. No respiratory distress.  Abdominal:     Palpations: Abdomen is soft.  Musculoskeletal:     Cervical back: Neck supple.     Comments: Tenderness along the neck, patient holding her right arm for comfort  Skin:    General: Skin is warm and dry.  Neurological:     Mental Status: She is alert and oriented to person, place, and time.     Comments: Strength difficult to test secondary to pain  Psychiatric:        Mood and Affect: Mood normal.     (all labs ordered are listed, but only abnormal results are displayed) Labs Reviewed  CBG MONITORING, ED - Abnormal; Notable for the following components:      Result Value   Glucose-Capillary 117 (*)    All other components within normal limits    EKG: None  Radiology:  No results found.   Procedures   Medications Ordered in the ED  ketorolac  (TORADOL ) 30 MG/ML injection 30 mg (30 mg Intramuscular Given 03/03/24 0556)  promethazine (PHENERGAN) injection 12.5 mg (12.5 mg Intramuscular Given 03/03/24 0556)  oxyCODONE -acetaminophen  (PERCOCET/ROXICET) 5-325 MG per tablet 1 tablet (1 tablet Oral Given 03/03/24 0557)    Clinical Course as of 03/03/24 0630  Fri Mar 03, 2024  0620 On recheck, patient clinically improved.  Resting comfortably.  Still has some pain into her right arm but states she feels much better.  Is otherwise at her baseline.  We discussed that she needs to discuss with her surgeon pain management in the preoperative period.  He may or may not be okay with NSAIDs.  Will send home with some Zofran  to tolerate the tramadol. [CH]    Clinical Course User  Index [CH] Britanee Vanblarcom, Charmaine FALCON, MD                                 Medical Decision Making Risk Prescription drug management.   This patient presents to the ED for concern of neck pain, this involves an extensive number of treatment options, and is a complaint that carries with it a high risk of complications and morbidity.  I considered the following differential and admission for this acute, potentially life threatening condition.  The differential diagnosis includes ongoing worsening radicular pain, strain, fracture  MDM:    This is a 38 year old female who presents with acute on chronic neck and arm pain.  She is nontoxic.  Uncomfortable appearing.  States that the pain is the same character but just worse.  Is holding her right arm.  Has had radicular pain to some degree over the last 12 weeks.  Is due for surgery on Monday.  No new injury.  Do not feel she needs repeat imaging.  Her neuroexam is limited secondary to pain but she denies any obvious new weakness.  Patient was given Toradol , oxycodone , and Phenergan.  On recheck, she states she feels much better.  Discussed that I would defer pain management to her surgeon given that she is in the immediate preoperative period.  Will send home with Zofran  to help tolerate tramadol.  (Labs, imaging, consults)  Labs: I Ordered, and personally interpreted labs.  The pertinent results include: None  Imaging Studies ordered: I ordered imaging studies including none I independently visualized and interpreted imaging. I agree with the radiologist interpretation  Additional history obtained from chart review.  External records from outside source obtained and reviewed including prior evaluations  Cardiac Monitoring: The patient was maintained on a cardiac monitor.  If on the cardiac monitor, I personally viewed and interpreted the cardiac monitored which showed an underlying rhythm of: NS  Reevaluation: After the interventions noted above,  I reevaluated the patient and found that they have :improved  Social Determinants of Health:  lives independently  Disposition: Discharge  Co morbidities that complicate the patient evaluation  Past Medical History:  Diagnosis Date   Bulging of cervical intervertebral disc    Medical history non-contributory      Medicines Meds ordered this encounter  Medications   ketorolac  (TORADOL ) 30 MG/ML injection 30 mg   promethazine (PHENERGAN) injection 12.5 mg   oxyCODONE -acetaminophen  (PERCOCET/ROXICET) 5-325 MG per tablet 1 tablet    Refill:  0   ondansetron  (ZOFRAN -ODT) 4 MG disintegrating tablet    Sig: Take 1 tablet (4 mg  total) by mouth every 8 (eight) hours as needed.    Dispense:  20 tablet    Refill:  0    I have reviewed the patients home medicines and have made adjustments as needed  Problem List / ED Course: Problem List Items Addressed This Visit   None Visit Diagnoses       Cervical radiculopathy    -  Primary                Final diagnoses:  Cervical radiculopathy    ED Discharge Orders          Ordered    ondansetron  (ZOFRAN -ODT) 4 MG disintegrating tablet  Every 8 hours PRN        03/03/24 0628               Bari Charmaine FALCON, MD 03/03/24 [phone]

## 2024-03-03 NOTE — Discharge Instructions (Signed)
 You were seen today for worsening neck pain.  Follow-up with your surgeon later today for preop and instructions regarding ongoing pain management in the preoperative period.  I will send you home with some Zofran  to see if this helps you tolerate your tramadol.
